# Patient Record
Sex: Female | Born: 1980 | Race: White | Hispanic: No | Marital: Single | Smoking: Current every day smoker
Health system: Southern US, Community
[De-identification: ages and names within clinical notes are randomized; demographics above are authoritative.]

## PROBLEM LIST (undated history)

## (undated) ENCOUNTER — Ambulatory Visit: Admission: EM | Payer: Medicaid Other | Source: Home / Self Care

## (undated) HISTORY — PX: TUBAL LIGATION: SHX77

---

## 2019-05-11 ENCOUNTER — Other Ambulatory Visit: Payer: Self-pay

## 2019-05-11 ENCOUNTER — Inpatient Hospital Stay (HOSPITAL_COMMUNITY)
Admission: AD | Admit: 2019-05-11 | Discharge: 2019-05-11 | Disposition: A | Discharge status: Home / Self Care | Payer: Self-pay | Attending: Obstetrics and Gynecology | Admitting: Obstetrics and Gynecology

## 2019-05-11 DIAGNOSIS — Z9851 Tubal ligation status: Secondary | ICD-10-CM

## 2019-05-11 DIAGNOSIS — Z3202 Encounter for pregnancy test, result negative: Secondary | ICD-10-CM | POA: Insufficient documentation

## 2019-05-11 LAB — POCT PREGNANCY, URINE: Preg Test, Ur: NEGATIVE

## 2019-05-11 LAB — HCG, SERUM, QUALITATIVE: Preg, Serum: NEGATIVE

## 2019-05-11 NOTE — MAU Note (Signed)
+  HPT x2 over the weekend.  Had tubal 16 yrs ago.  Last 4 months periods have been irregular.   Is supposed to be on cycle now, had n/v over the weekend, little bit of side pain.  Concerned may have a preg stuck in her tube. No pain now, bleeding almost stopped.

## 2019-05-11 NOTE — MAU Provider Note (Signed)
None    S Ms. Gwendolyn Franco is a 38 y.o. No obstetric history on file. non-pregnant female who presents to MAU today with complaint of positive pregnancy test x 2 at home.   O BP 133/78 (BP Location: Right Arm)   Pulse 79   Temp 98.3 F (36.8 C) (Oral)   Resp 18   Ht 5\' 4"  (1.626 m)   Wt 121.3 kg   LMP 05/09/2019 Comment: last was 7/7  SpO2 100%   BMI 45.92 kg/m  Physical Exam  Constitutional: She is oriented to person, place, and time. She appears well-developed and well-nourished. No distress.  Neurological: She is alert and oriented to person, place, and time.  Skin: She is not diaphoretic.  Psychiatric: Her behavior is normal.    A Non pregnant female Medical screening exam complete Quant negative  Work note provided.   P Discharge from MAU in stable condition Patient given the option of transfer to [facility] for further evaluation or seek care in outpatient facility of choice List of options for follow-up given  Warning signs for worsening condition that would warrant emergency follow-up discussed Patient may return to MAU as needed for pregnancy related complaints  Clay Menser, Artist Pais, NP 05/11/2019 7:14 PM

## 2021-01-25 ENCOUNTER — Encounter: Payer: Self-pay | Admitting: General Practice

## 2021-01-25 ENCOUNTER — Ambulatory Visit (INDEPENDENT_AMBULATORY_CARE_PROVIDER_SITE_OTHER): Payer: Medicaid Other | Admitting: Family Medicine

## 2021-01-25 ENCOUNTER — Other Ambulatory Visit (HOSPITAL_COMMUNITY)
Admission: RE | Admit: 2021-01-25 | Discharge: 2021-01-25 | Disposition: A | Discharge status: Home / Self Care | Payer: Medicaid Other | Source: Ambulatory Visit | Attending: Family Medicine | Admitting: Family Medicine

## 2021-01-25 ENCOUNTER — Ambulatory Visit: Payer: Medicaid Other | Admitting: Clinical

## 2021-01-25 ENCOUNTER — Encounter: Payer: Self-pay | Admitting: Family Medicine

## 2021-01-25 VITALS — BP 127/82 | HR 92 | Ht 62.0 in | Wt 302.0 lb

## 2021-01-25 DIAGNOSIS — Z01419 Encounter for gynecological examination (general) (routine) without abnormal findings: Secondary | ICD-10-CM | POA: Insufficient documentation

## 2021-01-25 DIAGNOSIS — Z01411 Encounter for gynecological examination (general) (routine) with abnormal findings: Secondary | ICD-10-CM

## 2021-01-25 DIAGNOSIS — F4323 Adjustment disorder with mixed anxiety and depressed mood: Secondary | ICD-10-CM

## 2021-01-25 DIAGNOSIS — Z113 Encounter for screening for infections with a predominantly sexual mode of transmission: Secondary | ICD-10-CM

## 2021-01-25 DIAGNOSIS — N914 Secondary oligomenorrhea: Secondary | ICD-10-CM

## 2021-01-25 DIAGNOSIS — Z1231 Encounter for screening mammogram for malignant neoplasm of breast: Secondary | ICD-10-CM

## 2021-01-25 DIAGNOSIS — Z6841 Body Mass Index (BMI) 40.0 and over, adult: Secondary | ICD-10-CM

## 2021-01-25 DIAGNOSIS — R102 Pelvic and perineal pain: Secondary | ICD-10-CM

## 2021-01-25 DIAGNOSIS — Z720 Tobacco use: Secondary | ICD-10-CM | POA: Insufficient documentation

## 2021-01-25 DIAGNOSIS — Z23 Encounter for immunization: Secondary | ICD-10-CM

## 2021-01-25 NOTE — Assessment & Plan Note (Signed)
Check u/s and treat based on results

## 2021-01-25 NOTE — BH Specialist Note (Signed)
I reviewed patient visit with the [facility] Intern, and I concur with the treatment plan, as documented in the [facility] Intern note.   No charge for this visit due to [facility] Intern seeing patient.  Hulda Marin, MSW, LCSW Integrated Behavioral Health Clinician [facility] Integrated Behavioral Health Initial In-Person Visit  MRN: [id number] Name: Gwendolyn Franco  Number of Integrated Behavioral Health Clinician visits:: 1/6 Session Start time: 11:30AM Session End time: 12PM Total time: 30 minutes  Types of Service: Individual psychotherapy  Interpretor:No. Interpretor Name and Language: N/a   Warm Hand Off Completed.       Subjective: Gwendolyn Franco is a 40 y.o. female  Patient was referred by Tinnie Gens, MD for Anxiety. Patient reports the following symptoms/concerns: mood swings where she is tearful about every day or may start yelling, tearfulness, and recent genetic concerns Duration of problem: Since her mid 30s, increased in the last year; Severity of problem: severe  Objective: Mood: Depressed and Affect: Tearful Risk of harm to self or others: No plan to harm self or others  Life Context: Family and Social: two sons (17, 21), sister that she talks to frequently School/Work: Currently employed; works from home Self-Care: Goes outside to read, takes hot baths Life Changes: Boyfriend moved in within the last two years  Patient and/or Family's Strengths/Protective Factors: Sense of purpose  Goals Addressed: Patient will: 1. Reduce symptoms of: anxiety and depression 2. Increase knowledge and/or ability of: coping skills and stress reduction  3. Demonstrate ability to: Increase healthy adjustment to current life circumstances  Progress towards Goals: Ongoing  Interventions: Interventions utilized: Mindfulness or Relaxation Training and Sleep Hygiene  Standardized Assessments completed: GAD-7 and PHQ 9  Patient and/or  Family Response: Pt expressed understanding and willingness to adhere to treatment plan.  Patient Centered Plan: Patient is on the following Treatment Plan(s):  IBH  Assessment: Patient currently experiencing Adjustment disorder with mixed anxiety and depressed mood.   Patient may benefit from practice coping skills, using grounding techniques when she feels overwhelmed.  Plan: 1. Follow up with behavioral health clinician on : 01/30/2021 at 4:30PM 2. Behavioral recommendations: Practice deep breathing, create a list of relaxing activities she can do once a week 3. Referral(s): Integrated [employer] (In Clinic) 4. "From scale of 1-10, how likely are you to follow plan?": 5  Bea Graff (Supervisor: Hulda Marin)  Flowsheet Row Office Visit from [facility]/25/2021 in [facility]  PHQ-9 Total Score 13     GAD 7 : Generalized Anxiety Score 01/25/2021  Nervous, Anxious, on Edge 3  Control/stop worrying 3  Worry too much - different things 3  Trouble relaxing 3  Restless 3  Easily annoyed or irritable 3  Afraid - awful might happen 2  Total GAD 7 Score 20

## 2021-01-25 NOTE — Patient Instructions (Signed)
Preventive Care 11-41 Years Old, Female Preventive care refers to lifestyle choices and visits with your health care provider that can promote health and wellness. This includes:  A yearly physical exam. This is also called an annual wellness visit.  Regular dental and eye exams.  Immunizations.  Screening for certain conditions.  Healthy lifestyle choices, such as: ? Eating a healthy diet. ? Getting regular exercise. ? Not using drugs or products that contain nicotine and tobacco. ? Limiting alcohol use. What can I expect for my preventive care visit? Physical exam Your health care provider may check your:  Height and weight. These may be used to calculate your BMI (body mass index). BMI is a measurement that tells if you are at a healthy weight.  Heart rate and blood pressure.  Body temperature.  Skin for abnormal spots. Counseling Your health care provider may ask you questions about your:  Past medical problems.  Family's medical history.  Alcohol, tobacco, and drug use.  Emotional well-being.  Home life and relationship well-being.  Sexual activity.  Diet, exercise, and sleep habits.  Work and work Statistician.  Access to firearms.  Method of birth control.  Menstrual cycle.  Pregnancy history. What immunizations do I need? Vaccines are usually given at various ages, according to a schedule. Your health care provider will recommend vaccines for you based on your age, medical history, and lifestyle or other factors, such as travel or where you work.   What tests do I need? Blood tests  Lipid and cholesterol levels. These may be checked every 5 years starting at age 64.  Hepatitis C test.  Hepatitis B test. Screening  Diabetes screening. This is done by checking your blood sugar (glucose) after you have not eaten for a while (fasting).  STD (sexually transmitted disease) testing, if you are at risk.  BRCA-related cancer screening. This may  be done if you have a family history of breast, ovarian, tubal, or peritoneal cancers.  Pelvic exam and Pap test. This may be done every 3 years starting at age 61. Starting at age 82, this may be done every 5 years if you have a Pap test in combination with an HPV test. Talk with your health care provider about your test results, treatment options, and if necessary, the need for more tests.   Follow these instructions at home: Eating and drinking  Eat a healthy diet that includes fresh fruits and vegetables, whole grains, lean protein, and low-fat dairy products.  Take vitamin and mineral supplements as recommended by your health care provider.  Do not drink alcohol if: ? Your health care provider tells you not to drink. ? You are pregnant, may be pregnant, or are planning to become pregnant.  If you drink alcohol: ? Limit how much you have to 0-1 drink a day. ? Be aware of how much alcohol is in your drink. In the [place] one drink equals one 12 oz bottle of beer (355 mL), one 5 oz glass of wine (148 mL), or one 1 oz glass of hard liquor (44 mL).   Lifestyle  Take daily care of your teeth and gums. Brush your teeth every morning and night with fluoride toothpaste. Floss one time each day.  Stay active. Exercise for at least 30 minutes 5 or more days each week.  Do not use any products that contain nicotine or tobacco, such as cigarettes, e-cigarettes, and chewing tobacco. If you need help quitting, ask your health care provider.  Do  not use drugs.  If you are sexually active, practice safe sex. Use a condom or other form of protection to prevent STIs (sexually transmitted infections).  If you do not wish to become pregnant, use a form of birth control. If you plan to become pregnant, see your health care provider for a prepregnancy visit.  Find healthy ways to cope with stress, such as: ? Meditation, yoga, or listening to music. ? Journaling. ? Talking to a trusted  person. ? Spending time with friends and family. Safety  Always wear your seat belt while driving or riding in a vehicle.  Do not drive: ? If you have been drinking alcohol. Do not ride with someone who has been drinking. ? When you are tired or distracted. ? While texting.  Wear a helmet and other protective equipment during sports activities.  If you have firearms in your house, make sure you follow all gun safety procedures.  Seek help if you have been physically or sexually abused. What's next?  Go to your health care provider once a year for an annual wellness visit.  Ask your health care provider how often you should have your eyes and teeth checked.  Stay up to date on all vaccines. This information is not intended to replace advice given to you by your health care provider. Make sure you discuss any questions you have with your health care provider. Document Revised: 05/22/2020 Document Reviewed: 06/05/2018 Elsevier Patient Education  2021 [employer].

## 2021-01-25 NOTE — Assessment & Plan Note (Signed)
Check FSH and TSH

## 2021-01-25 NOTE — Assessment & Plan Note (Signed)
No s/sx's of DM.

## 2021-01-25 NOTE — Progress Notes (Signed)
  Subjective:     Gwendolyn Franco is a 40 y.o. female and is here for a comprehensive physical exam. The patient reports problems - has not seen a physician in 17 years. Reports she skipped a cycle in February but mostly they are regular. Pelvic pain left side worse. Prior to cycles and with cycles and intercourse.    The following portions of the patient's history were reviewed and updated as appropriate: allergies, current medications, past family history, past medical history, past social history, past surgical history and problem list.  Review of Systems Pertinent items noted in HPI and remainder of comprehensive ROS otherwise negative.   Objective:    BP 127/82   Pulse 92   Ht 5\' 2"  (1.575 m)   Wt (!) 302 lb (137 kg)   LMP 01/17/2021 (Exact Date)   BMI 55.24 kg/m  General appearance: alert, cooperative and appears stated age Head: Normocephalic, without obvious abnormality, atraumatic Neck: no adenopathy, supple, symmetrical, trachea midline and thyroid not enlarged, symmetric, no tenderness/mass/nodules Lungs: clear to auscultation bilaterally Breasts: normal appearance, no masses or tenderness Heart: regular rate and rhythm, S1, S2 normal, no murmur, click, rub or gallop Abdomen: soft, non-tender; bowel sounds normal; no masses,  no organomegaly Pelvic: cervix normal in appearance, external genitalia normal, no cervical motion tenderness, uterus normal size, shape, and consistency, vagina normal without discharge and uterus and adnexa diffusely tender Extremities: Homans sign is negative, no sign of DVT Pulses: 2+ and symmetric Skin: Skin color, texture, turgor normal. No rashes or lesions Lymph nodes: Cervical, supraclavicular, and axillary nodes normal. Neurologic: Grossly normal    Assessment:    GYN female exam.      Plan:      Problem List Items Addressed This Visit      Unprioritized   Secondary oligomenorrhea    Check FSH and TSH      Relevant Orders    Follicle stimulating hormone   TSH   Pelvic pain    Check u/s and treat based on results      Relevant Orders   03/19/2021 PELVIC COMPLETE WITH TRANSVAGINAL   Class 3 severe obesity due to excess calories without serious comorbidity with body mass index (BMI) of 50.0 to 59.9 in adult (HCC)    No s/sx's of DM.      Tobacco use    Would not be a candidate for COC's due to age + smoking status       Other Visit Diagnoses    Screening mammogram for breast cancer    -  Primary   Relevant Orders   MM 3D SCREEN BREAST BILATERAL   Pap smear, as part of routine gynecological examination       Relevant Orders   Cytology - PAP( )   Screening for STD (sexually transmitted disease)       Relevant Orders   RPR   HIV Antibody (routine testing w rflx)   Hepatitis B Surface AntiGEN   Hepatitis C Antibody   Encounter for gynecological examination with abnormal finding       Need for Tdap vaccination       Relevant Orders   Tdap vaccine greater than or equal to 7yo IM (Completed)     Return in 6 weeks (on 03/08/2021) for virtual, a follow-up.  See After Visit Summary for Counseling Recommendations

## 2021-01-25 NOTE — Assessment & Plan Note (Signed)
Would not be a candidate for COC's due to age + smoking status

## 2021-01-26 LAB — RPR: RPR Ser Ql: NONREACTIVE

## 2021-01-26 LAB — HEPATITIS C ANTIBODY: Hep C Virus Ab: <0.1 s/co ratio (ref 0.0–0.9)

## 2021-01-26 LAB — FOLLICLE STIMULATING HORMONE: FSH: 8 m[IU]/mL

## 2021-01-26 LAB — HEPATITIS B SURFACE ANTIGEN: Hepatitis B Surface Ag: NEGATIVE

## 2021-01-26 LAB — HIV ANTIBODY (ROUTINE TESTING W REFLEX): HIV Screen 4th Generation wRfx: NONREACTIVE

## 2021-01-26 LAB — TSH: TSH: 3.63 u[IU]/mL (ref 0.450–4.500)

## 2021-01-26 LAB — SPECIMEN STATUS REPORT

## 2021-01-26 NOTE — BH Specialist Note (Deleted)
Integrated Behavioral Health via Telemedicine Visit  01/26/2021 Gwendolyn Franco [id number]  Number of Integrated Behavioral Health visits: *** Session Start time: 4:30***  Session End time: 5:30*** Total time: {IBH Total OZHY:86578469}  Referring Provider: *** Patient/Family location: Home*** [facility] Provider location: [facility]  All persons participating in visit: Patient *** and [facility] Johnetta Sloniker ***  Types of Service: {CHL AMB TYPE OF SERVICE:[phone]}  I connected with Tysha A Schramm and/or Tiera A Salomon's {family members:20773} via  Telephone or Engineer, civil (consulting)  (Video is Caregility application) and verified that I am speaking with the correct person using two identifiers. Discussed confidentiality: {YES/NO:21197}  I discussed the limitations of telemedicine and the availability of in person appointments.  Discussed there is a possibility of technology failure and discussed alternative modes of communication if that failure occurs.  I discussed that engaging in this telemedicine visit, they consent to the provision of behavioral healthcare and the services will be billed under their insurance.  Patient and/or legal guardian expressed understanding and consented to Telemedicine visit: {YES/NO:21197}  Presenting Concerns: Patient and/or family reports the following symptoms/concerns: *** Duration of problem: ***; Severity of problem: {Mild/Moderate/Severe:20260}  Patient and/or Family's Strengths/Protective Factors: {CHL AMB BH PROTECTIVE FACTORS:[phone]}  Goals Addressed: Patient will: 1.  Reduce symptoms of: {IBH Symptoms:21014056}  2.  Increase knowledge and/or ability of: {IBH Patient Tools:21014057}  3.  Demonstrate ability to: {IBH Goals:21014053}  Progress towards Goals: {CHL AMB BH PROGRESS TOWARDS GOALS:[phone]}  Interventions: Interventions utilized:  {IBH  Interventions:21014054} Standardized Assessments completed: {IBH Screening Tools:21014051}  Patient and/or Family Response: ***  Assessment: Patient currently experiencing ***.   Patient may benefit from ***.  Plan: 1. Follow up with behavioral health clinician on : *** 2. Behavioral recommendations: *** 3. Referral(s): {IBH Referrals:21014055}  I discussed the assessment and treatment plan with the patient and/or parent/guardian. They were provided an opportunity to ask questions and all were answered. They agreed with the plan and demonstrated an understanding of the instructions.   They were advised to call back or seek an in-person evaluation if the symptoms worsen or if the condition fails to improve as anticipated.  Rae Lips, LCSW   Depression screen [facility] 2/9 01/25/2021  Decreased Interest 3  Down, Depressed, Hopeless 3  PHQ - 2 Score 6  Altered sleeping 0  Tired, decreased energy 2  Change in appetite 0  Feeling bad or failure about yourself  3  Trouble concentrating 0  Moving slowly or fidgety/restless 2  Suicidal thoughts 0  PHQ-9 Score 13   GAD 7 : Generalized Anxiety Score 01/25/2021  Nervous, Anxious, on Edge 3  Control/stop worrying 3  Worry too much - different things 3  Trouble relaxing 3  Restless 3  Easily annoyed or irritable 3  Afraid - awful might happen 2  Total GAD 7 Score 20   ***

## 2021-01-27 LAB — CYTOLOGY - PAP
Adequacy: ABSENT
Chlamydia: NEGATIVE
Comment: NEGATIVE
Comment: NEGATIVE
Comment: NEGATIVE
Comment: NORMAL
Diagnosis: NEGATIVE
High risk HPV: NEGATIVE
Neisseria Gonorrhea: NEGATIVE
Trichomonas: NEGATIVE

## 2021-01-30 ENCOUNTER — Other Ambulatory Visit: Payer: Self-pay

## 2021-01-30 ENCOUNTER — Ambulatory Visit
Admission: RE | Admit: 2021-01-30 | Discharge: 2021-01-30 | Disposition: A | Discharge status: Home / Self Care | Payer: Medicaid Other | Source: Ambulatory Visit | Attending: Family Medicine | Admitting: Family Medicine

## 2021-01-30 ENCOUNTER — Telehealth: Payer: Self-pay | Admitting: Lactation Services

## 2021-01-30 DIAGNOSIS — Z1231 Encounter for screening mammogram for malignant neoplasm of breast: Secondary | ICD-10-CM

## 2021-01-30 NOTE — Telephone Encounter (Signed)
Patient called and LM on Nurse voicemail that she needs to cancel her Behavioral Health appointment today. She reports it is not covered by insurance and she cannot afford to come. Message to [facility], LCSW and front office to cancel.

## 2021-02-13 ENCOUNTER — Ambulatory Visit
Admission: RE | Admit: 2021-02-13 | Discharge: 2021-02-13 | Disposition: A | Discharge status: Home / Self Care | Payer: Medicaid Other | Source: Ambulatory Visit | Attending: Family Medicine | Admitting: Family Medicine

## 2021-02-13 ENCOUNTER — Other Ambulatory Visit: Payer: Self-pay

## 2021-02-13 DIAGNOSIS — R102 Pelvic and perineal pain: Secondary | ICD-10-CM | POA: Insufficient documentation

## 2021-03-07 ENCOUNTER — Encounter (INDEPENDENT_AMBULATORY_CARE_PROVIDER_SITE_OTHER): Payer: Self-pay

## 2021-03-09 ENCOUNTER — Encounter: Payer: Medicaid Other | Admitting: Family Medicine

## 2021-03-09 NOTE — Progress Notes (Signed)
8:33a- Called Pt to strt My Chart visit, no answer, left VM.  8:45a-2nd attempt, still no answer, VM full.

## 2021-03-09 NOTE — Progress Notes (Signed)
Patient did not keep appointment today. She may call to reschedule.  

## 2022-04-14 IMAGING — US US PELVIS COMPLETE WITH TRANSVAGINAL
1 series · 15 of 25 positions shown · non-contrast
Comparison: None

CLINICAL DATA: Pelvic pain, history Caesarean section, tubal
ligation, LMP 02/10/2021



[Series 1: us pelvis complete with transvaginal · 15 of 95 slices shown]
[im 1/95]
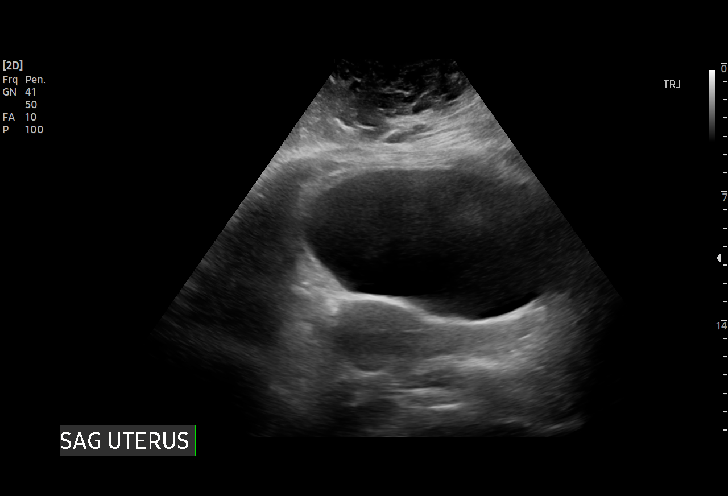
[im 8/95]
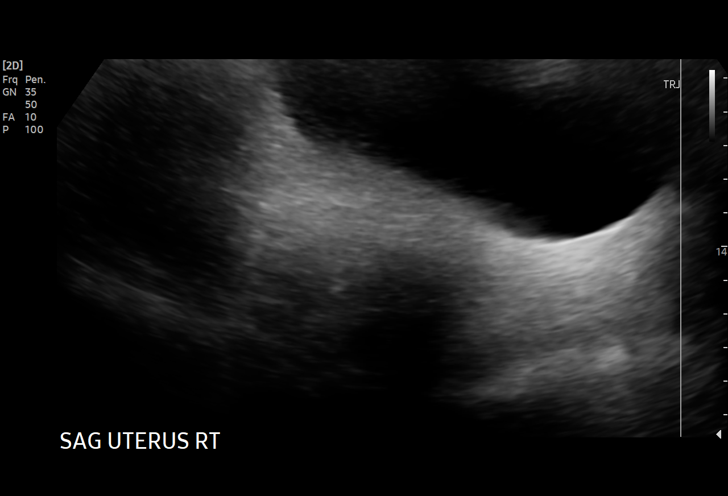
[im 16/95]
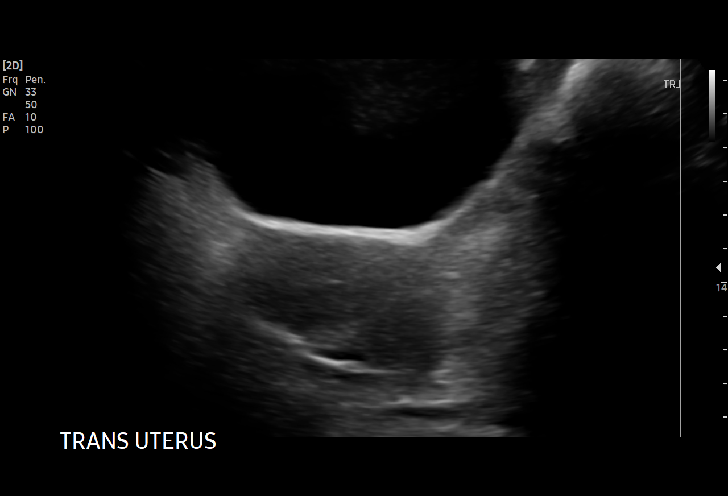
[im 20/95]
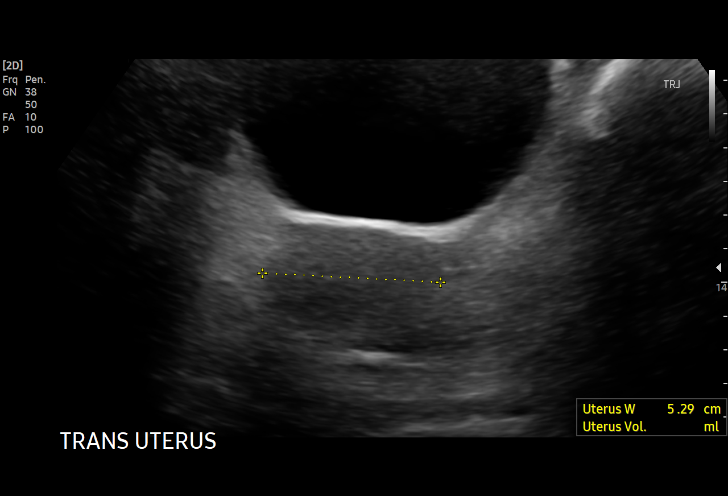
[im 28/95]
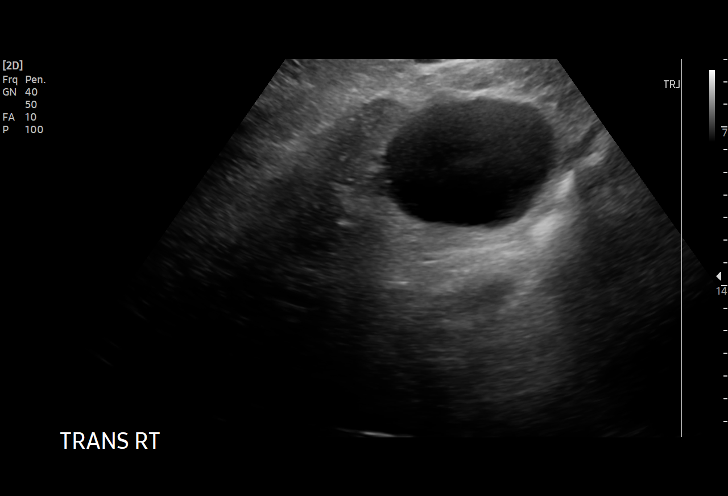
[im 36/95]
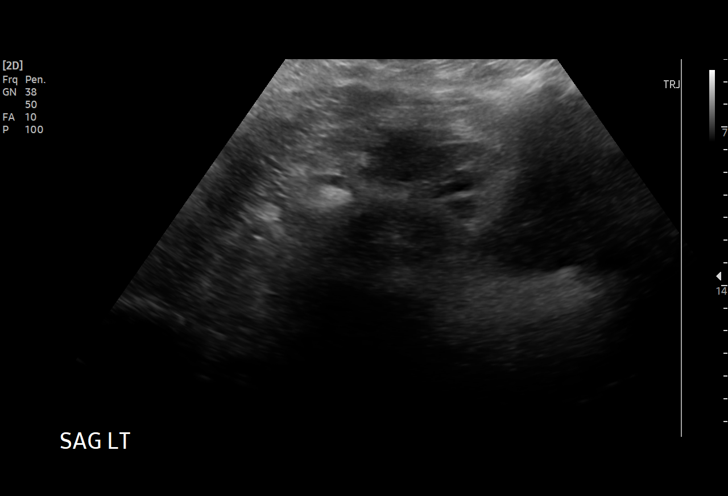
[im 40/95]
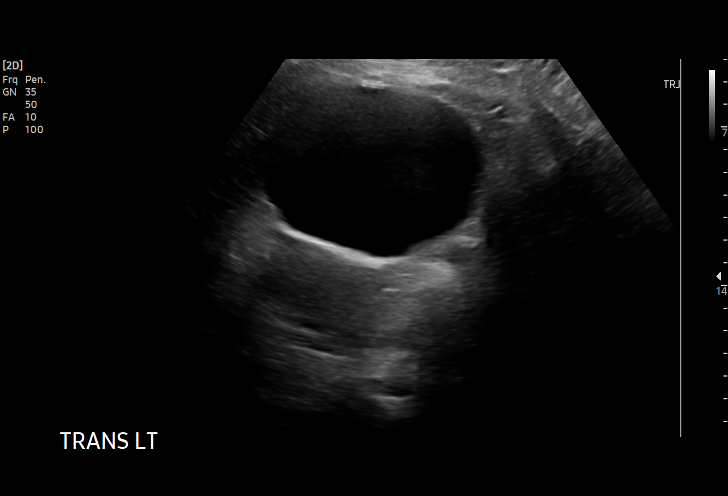
[im 48/95]
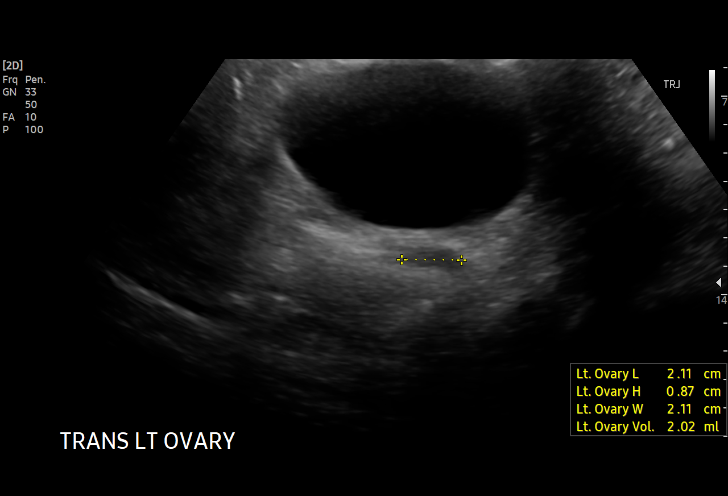
[im 55/95]
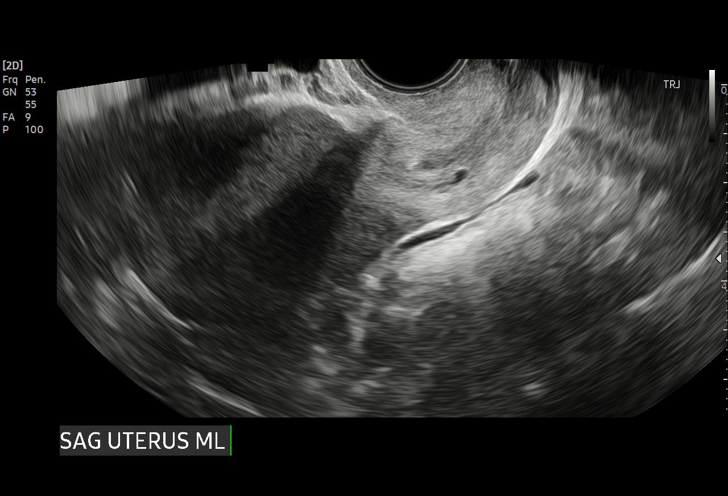
[im 59/95]
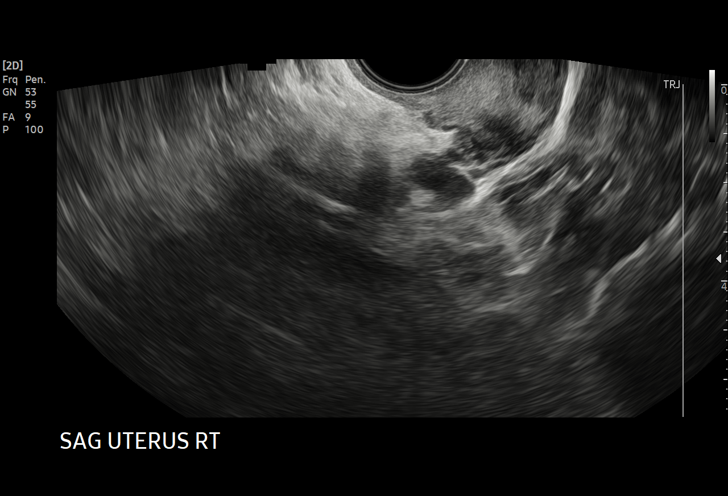
[im 67/95]
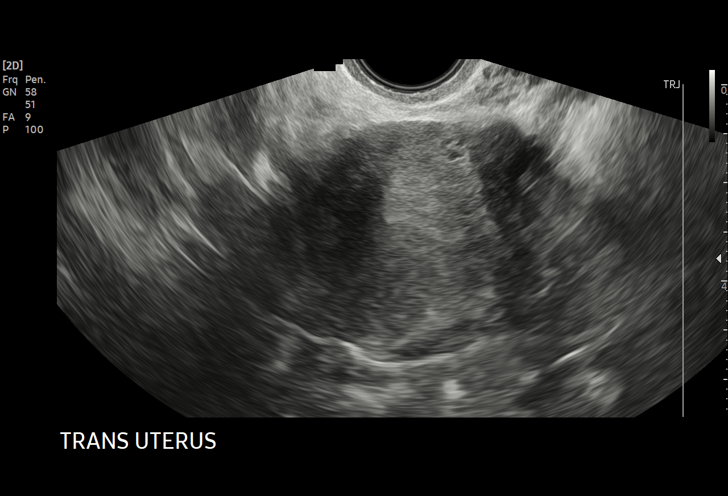
[im 75/95]
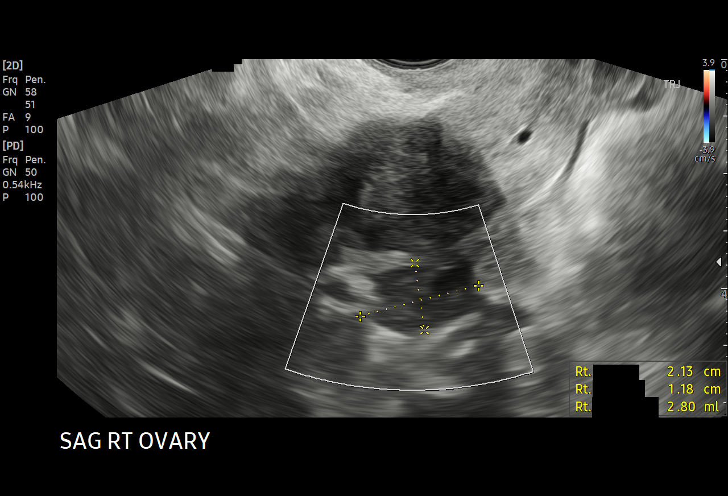
[im 79/95]
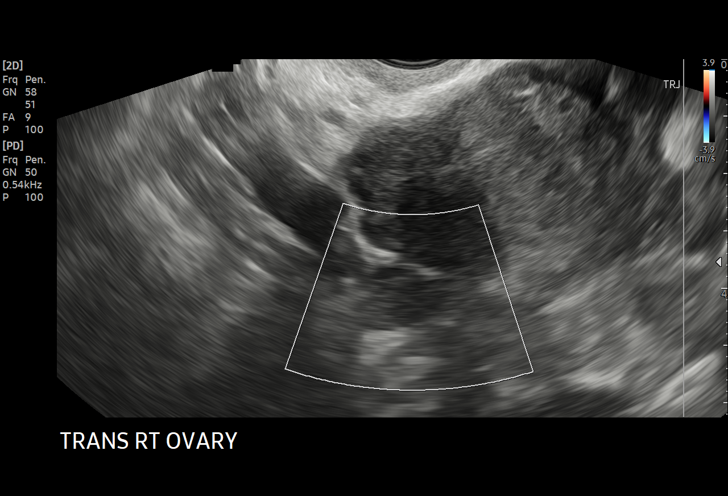
[im 87/95]
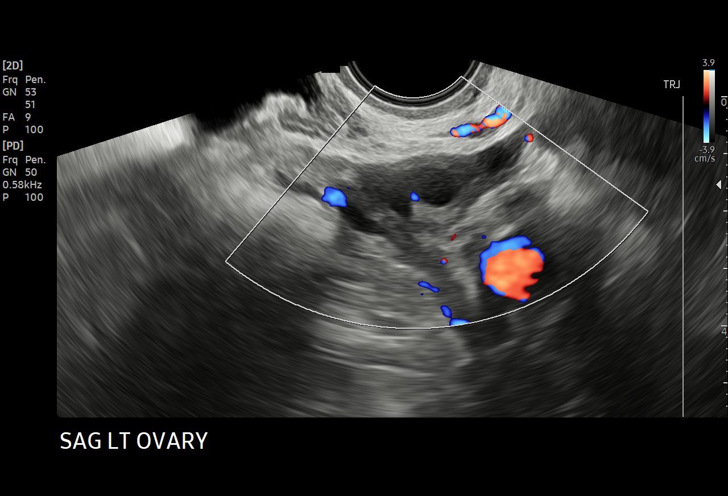
[im 95/95]
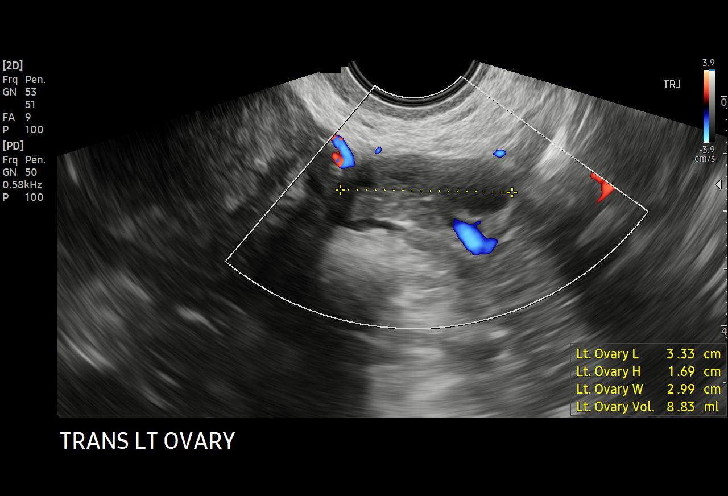

[15 of 25 positions shown; findings below may reference images not displayed]

FINDINGS: Uterus

Measurements: 9.5 x 4.4 x 4.7 cm = volume: 101 mL. Anteverted.
Heterogeneous myometrium. Shadowing from Caesarean section scar. No
discrete mass.

Endometrium

Thickness: 8 mm.  No endometrial fluid or focal abnormality

Right ovary

Measurements: 2.1 x 1.2 x 1.3 cm = volume: 1.7 mL. Normal morphology
without mass

Left ovary

Measurements: 3.3 x 1.7 x 3.0 cm = volume: 8.8 mL. Normal morphology
without mass

Other findings

No free pelvic fluid.  No adnexal masses.
IMPRESSION: Normal exam.

## 2022-08-25 ENCOUNTER — Telehealth: Payer: Medicaid Other | Admitting: Nurse Practitioner

## 2022-08-25 DIAGNOSIS — M25572 Pain in left ankle and joints of left foot: Secondary | ICD-10-CM

## 2022-08-25 DIAGNOSIS — M79645 Pain in left finger(s): Secondary | ICD-10-CM | POA: Diagnosis not present

## 2022-08-25 NOTE — Patient Instructions (Signed)
  Ferrel Logan, thank you for joining Bennie Pierini, FNP for today's virtual visit.  While this provider is not your primary care provider (PCP), if your PCP is located in our provider database this encounter information will be shared with them immediately following your visit.   A [facility] MyChart account gives you access to today's visit and all your visits, tests, and labs performed at [facility] " click here if you don't have a [facility] MyChart account or go to [email or web address]  Consent: (Patient) Gwendolyn Franco provided verbal consent for this virtual visit at the beginning of the encounter.  Current Medications:  Current Outpatient Medications:    Multiple Vitamins-Minerals (WOMENS MULTI VITAMIN & MINERAL PO), Take 1 tablet by mouth daily., Disp: , Rfl:    omeprazole (PRILOSEC) 20 MG capsule, Take 20 mg by mouth daily., Disp: , Rfl:    Medications ordered in this encounter:  No orders of the defined types were placed in this encounter.    *If you need refills on other medications prior to your next appointment, please contact your pharmacy*  Follow-Up: Call back or seek an in-person evaluation if the symptoms worsen or if the condition fails to improve as anticipated.  [facility] [phone]  Other Instructions Ice Elevate Motrin OTC Really need t9o go  to urgent care for xrays   If you have been instructed to have an in-person evaluation today at a local Urgent Care facility, please use the link below. It will take you to a list of all of our available [place] [facility], including address, phone number and hours of operation. Please do not delay care.  [facility]  If you or a family member do not have a primary care provider, use the link below to schedule a visit and establish care. When you choose a [place] primary care physician or advanced practice provider, you gain a long-term partner in  health. Find a Primary Care Provider  Learn more about [place]'s in-office and virtual care options: [place] - Get Care Now

## 2022-08-25 NOTE — Progress Notes (Signed)
Virtual Visit Consent   Gwendolyn Franco, you are scheduled for a virtual visit with Mary-Margaret Daphine Deutscher, FNP, a [facility] provider, today.     Just as with appointments in the office, your consent must be obtained to participate.  Your consent will be active for this visit and any virtual visit you may have with one of our providers in the next 365 days.     If you have a MyChart account, a copy of this consent can be sent to you electronically.  All virtual visits are billed to your insurance company just like a traditional visit in the office.    As this is a virtual visit, video technology does not allow for your provider to perform a traditional examination.  This may limit your provider's ability to fully assess your condition.  If your provider identifies any concerns that need to be evaluated in person or the need to arrange testing (such as labs, EKG, etc.), we will make arrangements to do so.     Although advances in technology are sophisticated, we cannot ensure that it will always work on either your end or our end.  If the connection with a video visit is poor, the visit may have to be switched to a telephone visit.  With either a video or telephone visit, we are not always able to ensure that we have a secure connection.     I need to obtain your verbal consent now.   Are you willing to proceed with your visit today? YES   Dawnya A Digirolamo has provided verbal consent on 08/25/2022 for a virtual visit (video or telephone).   Mary-Margaret Daphine Deutscher, FNP   Date: 08/25/2022 1:46 PM   Virtual Visit via Video Note   I, Mary-Margaret Reilly Blades, connected with AZLEE MONFORTE ([id number], 05-28-1981) on 08/25/22 at  2:00 PM EST by a video-enabled telemedicine application and verified that I am speaking with the correct person using two identifiers.  Location: Patient: Virtual Visit Location Patient: Home Provider: Virtual Visit Location Provider: Mobile   I discussed the limitations of  evaluation and management by telemedicine and the availability of in person appointments. The patient expressed understanding and agreed to proceed.    History of Present Illness: Gwendolyn Franco is a 41 y.o. who identifies as a female who was assigned female at birth, and is being seen today for ankle pain .  HPI: Patient fell out her back door and injured her: - pinky finger- left- pain  to move and is swollen- rates pain 10/10 - ankle- left- pain and swollen-ratespain 10/10.    Review of Systems  Musculoskeletal:  Positive for joint pain (left pinky finger and left ankle.).    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   Pelvic pain 01/25/2021   Class 3 severe obesity due to excess calories without serious comorbidity with body mass index (BMI) of 50.0 to 59.9 in adult [facility]) 01/25/2021   Tobacco use 01/25/2021    Allergies:  Allergies  Allergen Reactions   Hydrocodone Shortness Of Breath, Swelling and Rash   Medications:  Current Outpatient Medications:    Multiple Vitamins-Minerals (WOMENS MULTI VITAMIN & MINERAL PO), Take 1 tablet by mouth daily., Disp: , Rfl:    omeprazole (PRILOSEC) 20 MG capsule, Take 20 mg by mouth daily., Disp: , Rfl:   Observations/Objective: Patient is well-developed, well-nourished in no acute distress.  Resting comfortably  at home.  Head is normocephalic, atraumatic.  No labored breathing.  Speech is clear and coherent with logical content.  Patient is alert and oriented at baseline.  Swollen left 5th finger- limited rom due to pain Left ankle swollen with pain on innversion and eversion   Assessment and Plan:  Cyera A Abee in today with chief complaint of Ankle Pain   1. Acute left ankle pain 2. Finger pain, left Ice bid Rest  Elevate Motrin OTC Really needs xrays to make sure no broken bones      Follow Up Instructions: I discussed the assessment and treatment plan  with the patient. The patient was provided an opportunity to ask questions and all were answered. The patient agreed with the plan and demonstrated an understanding of the instructions.  A copy of instructions were sent to the patient via MyChart.  The patient was advised to call back or seek an in-person evaluation if the symptoms worsen or if the condition fails to improve as anticipated.  Time:  I spent 6 minutes with the patient via telehealth technology discussing the above problems/concerns.    Mary-Margaret Daphine Deutscher, FNP

## 2022-08-26 ENCOUNTER — Ambulatory Visit (INDEPENDENT_AMBULATORY_CARE_PROVIDER_SITE_OTHER): Payer: Medicaid Other

## 2022-08-26 ENCOUNTER — Ambulatory Visit
Admission: EM | Admit: 2022-08-26 | Discharge: 2022-08-26 | Disposition: A | Discharge status: Home / Self Care | Payer: Medicaid Other | Attending: Internal Medicine | Admitting: Internal Medicine

## 2022-08-26 DIAGNOSIS — M79642 Pain in left hand: Secondary | ICD-10-CM | POA: Diagnosis not present

## 2022-08-26 DIAGNOSIS — S62647A Nondisplaced fracture of proximal phalanx of left little finger, initial encounter for closed fracture: Secondary | ICD-10-CM

## 2022-08-26 DIAGNOSIS — M79662 Pain in left lower leg: Secondary | ICD-10-CM

## 2022-08-26 DIAGNOSIS — W19XXXA Unspecified fall, initial encounter: Secondary | ICD-10-CM

## 2022-08-26 DIAGNOSIS — M25572 Pain in left ankle and joints of left foot: Secondary | ICD-10-CM | POA: Diagnosis not present

## 2022-08-26 DIAGNOSIS — M79672 Pain in left foot: Secondary | ICD-10-CM | POA: Diagnosis not present

## 2022-08-26 DIAGNOSIS — S82839A Other fracture of upper and lower end of unspecified fibula, initial encounter for closed fracture: Secondary | ICD-10-CM

## 2022-08-26 MED ORDER — IBUPROFEN 800 MG PO TABS: 800.0000 mg | ORAL_TABLET | Freq: Three times a day (TID) | ORAL | 0 refills | Status: DC | PRN

## 2022-08-26 NOTE — ED Triage Notes (Signed)
Pt presents to [facility] with co of fall yesterday around 12; 30 in the afternoon. Pt reports she was trying to move her [employer] toy outside when she fell down the stairs and landed on her L hand and hurt her L ankle/ shin on the outer side. Pain is worse when moving joints. Pt has taken 800 mg motrin 2 times since incident

## 2022-08-26 NOTE — Discharge Instructions (Signed)
You have broken your left little finger as well as your ankle.  A boot has been applied for your ankle and crutches.  Nonweightbearing until otherwise advised by orthopedist.  No pushing, pulling, lifting with left hand until otherwise advised.  A splint has been applied to your left finger.  Recommend ice application and elevation of both extremities.  I have prescribed you ibuprofen to take as needed for pain.  Do not take any additional ibuprofen, Advil or Aleve while taking this prescription ibuprofen.  Follow-up with orthopedist tomorrow to schedule appointment for further evaluation and management.

## 2022-08-26 NOTE — ED Provider Notes (Signed)
[facility]    CSN: [id number] Arrival date & time: 08/26/22  1008      History   Chief Complaint Chief Complaint  Patient presents with   Fall    HPI Gwendolyn Franco is a 41 y.o. female.   Patient presents for left hand pain and left lower leg pain that started yesterday around 12:30 after a fall.  Patient reports that she tripped trying to move a bicycle while going down the stairs and fell down 1-2 steps.  She denies hitting her head or losing consciousness.  She states that she fell forward and landed on her left hand.  She also thinks that she twisted her left ankle.  She is having pain from the foot all the way up to the top of the lower leg.  She has taken ibuprofen with minimal improvement.  Denies any numbness or tingling.   Fall    History reviewed. No pertinent past medical history.  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) 01/25/2021   Tobacco use 01/25/2021    Past Surgical History:  Procedure Laterality Date   CESAREAN SECTION     x 2   TUBAL LIGATION      OB History     Gravida  3   Para  2   Term  2   Preterm      AB  1   Living  2      SAB  1   IAB      Ectopic      Multiple      Live Births  2            Home Medications    Prior to Admission medications   Medication Sig Start Date End Date Taking? Authorizing Provider  ibuprofen (ADVIL) 800 MG tablet Take 1 tablet (800 mg total) by mouth every 8 (eight) hours as needed for mild pain. 08/26/22  Yes Analeise Mccleery, Michele Rockers, FNP  Multiple Vitamins-Minerals (WOMENS MULTI VITAMIN & MINERAL PO) Take 1 tablet by mouth daily.    [provider]  omeprazole (PRILOSEC) 20 MG capsule Take 20 mg by mouth daily.    [provider]    Family  History Family History  Problem Relation Age of Onset   Hypertension Mother    Glaucoma Mother    Dementia Mother    Thyroid disease Mother    Diabetes Father    Breast cancer Sister 68   Thyroid disease Sister    Lung cancer Maternal Grandfather    Breast cancer Maternal Aunt    BRCA 1/2 Niece     Social History Social History   Tobacco Use   Smoking status: Every Day    Packs/day: 0.50    Years: 20.00    Total pack years: 10.00    Types: Cigarettes   Smokeless tobacco: Never  Substance Use Topics   Alcohol use: Yes    Comment: occ wine   Drug use: Never     Allergies   Hydrocodone   Review of Systems Review of Systems Per HPI  Physical Exam Triage Vital Signs ED Triage Vitals  Enc Vitals Group     BP 08/26/22 1101 132/79     Pulse Rate 08/26/22 1101 92  Resp 08/26/22 1101 19     Temp 08/26/22 1101 (!) 97.4 F (36.3 C)     Temp Source 08/26/22 1101 Oral     SpO2 08/26/22 1101 97 %     Weight --      Height --      Head Circumference --      Peak Flow --      Pain Score 08/26/22 1059 7     Pain Loc --      Pain Edu? --      Excl. in [place]? --    No data found.  Updated Vital Signs BP 132/79   Pulse 92   Temp (!) 97.4 F (36.3 C) (Oral)   Resp 19   LMP 07/30/2022   SpO2 97%   Visual Acuity Right Eye Distance:   Left Eye Distance:   Bilateral Distance:    Right Eye Near:   Left Eye Near:    Bilateral Near:     Physical Exam Constitutional:      General: She is not in acute distress.    Appearance: Normal appearance. She is not toxic-appearing or diaphoretic.  HENT:     Head: Normocephalic and atraumatic.  Eyes:     Extraocular Movements: Extraocular movements intact.     Conjunctiva/sclera: Conjunctivae normal.  Pulmonary:     Effort: Pulmonary effort is normal.  Musculoskeletal:       Legs:     Comments: Tenderness to palpation with associated swelling and bruising discoloration present to fourth and fifth fingers of left  hand that extends slightly into the dorsal surface of the hand.  Patient can wiggle fingers.  Grip strength is 5/5.  Neurovascular intact.  No obvious abrasions or lacerations noted.  Tenderness to palpation throughout dorsal surface of left foot.  No tenderness to toes.  Patient can wiggle toes.  Patient has circumferential swelling to left ankle with associated tenderness throughout anterior, medial, lateral ankle.  Patient has limited range of motion of ankle due to pain.  Tenderness to palpation to mid anterior shin of left lower extremity as well.  Also has tenderness to palpation to bilateral left lower leg at top of tip/fib.  Left lower extremity is neurovascularly intact throughout.  No obvious discoloration, lacerations, abrasions noted. No tenderness to knee.   Neurological:     General: No focal deficit present.     Mental Status: She is alert and oriented to person, place, and time. Mental status is at baseline.  Psychiatric:        Mood and Affect: Mood normal.        Behavior: Behavior normal.        Thought Content: Thought content normal.        Judgment: Judgment normal.      UC Treatments / Results  Labs (all labs ordered are listed, but only abnormal results are displayed) Labs Reviewed - No data to display  EKG   Radiology DG Ankle Complete Left  Addendum Date: 08/26/2022   ADDENDUM REPORT: 08/26/2022 12:11 ADDENDUM: In the radiographs of left tibia and fibula, there is linear calcific density in the lateral margin of tip of lateral malleolus suggesting recent avulsion. Electronically Signed   By: Elmer Picker M.D.   On: 08/26/2022 12:11   Result Date: 08/26/2022 CLINICAL DATA:  Trauma, fall EXAM: LEFT ANKLE COMPLETE - 3+ VIEW COMPARISON:  None FINDINGS: No definite recent fracture or dislocation is seen. There are few smooth marginated calcifications adjacent to the  tip of lateral malleolus. There are few smooth marginated calcifications adjacent to cuboid.  Plantar spur is seen in calcaneus. There is soft tissue swelling around the ankle. IMPRESSION: No recent fracture or dislocation is seen. Small smooth marginated calcifications adjacent to the tip of lateral malleolus may be residual from previous injury. Plantar spur is seen in calcaneus. Electronically Signed: By: Elmer Picker M.D. On: 08/26/2022 12:05   DG Tibia/Fibula Left  Result Date: 08/26/2022 CLINICAL DATA:  Trauma EXAM: LEFT TIBIA AND FIBULA - 2 VIEW COMPARISON:  None FINDINGS: There is a linear calcific density adjacent to the lateral margin of tip of lateral malleolus suggesting possible recent avulsion. There are few smooth marginated calcifications inferior to the tip of lateral malleolus suggesting old avulsions. IMPRESSION: There is recent avulsion in the lateral margin of tip of lateral malleolus in the distal left fibula. Electronically Signed   By: Elmer Picker M.D.   On: 08/26/2022 12:10   DG Foot Complete Left  Result Date: 08/26/2022 CLINICAL DATA:  Trauma, fall EXAM: LEFT FOOT - COMPLETE 3+ VIEW COMPARISON:  None FINDINGS: No recent fracture or dislocation is seen. There are a few smooth marginated calcifications adjacent to cuboid suggesting ununited accessory ossification centers or sesamoid bones. Plantar spur is seen in calcaneus. IMPRESSION: No recent fracture or dislocation is seen in left foot. Electronically Signed   By: Elmer Picker M.D.   On: 08/26/2022 12:08   DG Hand Complete Left  Result Date: 08/26/2022 CLINICAL DATA:  Trauma, fall EXAM: LEFT HAND - COMPLETE 3+ VIEW COMPARISON:  None Available. FINDINGS: There is radiolucent line in the proximal shaft of proximal phalanx of fifth finger. Rest of the bony structures are unremarkable. IMPRESSION: There is radiolucent line in the proximal shaft of proximal phalanx of left fifth finger suggesting undisplaced fracture. Electronically Signed   By: Elmer Picker M.D.   On: 08/26/2022 12:07     Procedures Procedures (including critical care time)  Medications Ordered in UC Medications - No data to display  Initial Impression / Assessment and Plan / UC Course  I have reviewed the triage vital signs and the nursing notes.  Pertinent labs & imaging results that were available during my care of the patient were reviewed by me and considered in my medical decision making (see chart for details).     Left hand x-ray showing nondisplaced fracture to proximal phalanx of left fifth digit.  Finger splint applied in urgent care.  Left tibia-fibula x-ray showing possible avulsion fracture to distal left fibula at lateral malleolus.  Cam boot applied in urgent care with crutches.  Advised patient nonweightbearing until otherwise advised by orthopedist.  Otherwise unremarkable x-rays for any additional acute bony abnormalities.  Advised of supportive care including ice application and elevation of extremities.  Patient offered narcotic pain medication but declined stating that she wished to simply take ibuprofen.  No obvious contraindications to NSAIDs noted in patient's history.  Will prescribe prescription ibuprofen and patient advised to not take any additional NSAIDs while taking this prescription ibuprofen.  Patient advised to follow-up with provided contact information for hand specialty/orthopedist tomorrow to schedule an appointment for further evaluation and management.  Patient verbalized understanding and was agreeable with plan. Final Clinical Impressions(s) / UC Diagnoses   Final diagnoses:  Closed nondisplaced fracture of proximal phalanx of left little finger, initial encounter  Avulsion fracture of distal fibula  Pain of joint of left ankle and foot     Discharge Instructions  You have broken your left little finger as well as your ankle.  A boot has been applied for your ankle and crutches.  Nonweightbearing until otherwise advised by orthopedist.  No pushing, pulling,  lifting with left hand until otherwise advised.  A splint has been applied to your left finger.  Recommend ice application and elevation of both extremities.  I have prescribed you ibuprofen to take as needed for pain.  Do not take any additional ibuprofen, Advil or Aleve while taking this prescription ibuprofen.  Follow-up with orthopedist tomorrow to schedule appointment for further evaluation and management.     ED Prescriptions     Medication Sig Dispense Auth. Provider   ibuprofen (ADVIL) 800 MG tablet Take 1 tablet (800 mg total) by mouth every 8 (eight) hours as needed for mild pain. 21 tablet [place], Michele Rockers, [place]      PDMP not reviewed this encounter.   Teodora Medici, [place] 08/26/22 1239

## 2023-01-18 ENCOUNTER — Ambulatory Visit
Admission: EM | Admit: 2023-01-18 | Discharge: 2023-01-18 | Disposition: A | Discharge status: Home / Self Care | Payer: Medicaid Other | Attending: Emergency Medicine | Admitting: Emergency Medicine

## 2023-01-18 DIAGNOSIS — K047 Periapical abscess without sinus: Secondary | ICD-10-CM | POA: Diagnosis not present

## 2023-01-18 MED ORDER — PENICILLIN V POTASSIUM 500 MG PO TABS: 500.0000 mg | ORAL_TABLET | Freq: Three times a day (TID) | ORAL | 0 refills | Status: AC

## 2023-01-18 MED ORDER — IBUPROFEN 800 MG PO TABS: 800.0000 mg | ORAL_TABLET | Freq: Three times a day (TID) | ORAL | 0 refills | Status: AC | PRN

## 2023-01-18 NOTE — ED Provider Notes (Signed)
[facility]    CSN: [id number] Arrival date & time: 01/18/23  1314    HISTORY  No chief complaint on file.  HPI Gwendolyn Franco is a pleasant, 42 y.o. female who presents to urgent care today. Pt presents with right side dental pain since last night.  The history is provided by the patient.   History reviewed. No pertinent past medical history. 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 Authorizing Provider  ibuprofen (ADVIL) 800 MG tablet Take 1 tablet (800 mg total) by mouth every 8 (eight) hours as needed for up to 21 doses for fever, headache, mild pain or moderate pain. 01/18/23  Yes Theadora Rama Scales, PA-C  penicillin v potassium (VEETID) 500 MG tablet Take 1 tablet (500 mg total) by mouth 3 (three) times daily for 14 days. 01/18/23 02/01/23 Yes Theadora Rama Scales, PA-C  Multiple Vitamins-Minerals (WOMENS MULTI VITAMIN & MINERAL PO) Take 1 tablet by mouth daily.    [provider]  omeprazole (PRILOSEC) 20 MG capsule Take 20 mg by mouth daily.    [provider]    Family History Family History  Problem Relation Age of Onset   Hypertension Mother    Glaucoma Mother    Dementia Mother    Thyroid disease Mother    Diabetes Father    Breast cancer Sister 45   Thyroid disease Sister    Lung cancer Maternal Grandfather     Breast cancer Maternal Aunt    BRCA 1/2 Niece    Social History Social History   Tobacco Use   Smoking status: Every Day    Packs/day: 0.50    Years: 20.00    Additional pack years: 0.00    Total pack years: 10.00    Types: Cigarettes   Smokeless tobacco: Never  Substance Use Topics   Alcohol use: Yes    Comment: occ wine   Drug use: Never   Allergies   Hydrocodone  Review of Systems Review of Systems Pertinent findings revealed after performing a 14 point review of systems has been noted in the history of present illness.  Physical Exam Vital Signs BP 128/89 (BP Location: Left Arm)   Pulse 76   Temp 97.6 F (36.4 C) (Oral)   Resp 17   SpO2 96%   No data found.  Physical Exam Vitals and nursing note reviewed.  Constitutional:      General: She is not in acute distress.    Appearance: Normal appearance.  HENT:     Head: Normocephalic and atraumatic.  Mouth/Throat:     Dentition: Dental caries and dental abscesses present.   Eyes:     Pupils: Pupils are equal, round, and reactive to light.  Cardiovascular:     Rate and Rhythm: Normal rate and regular rhythm.  Pulmonary:     Effort: Pulmonary effort is normal.     Breath sounds: Normal breath sounds.  Musculoskeletal:        General: Normal range of motion.     Cervical back: Normal range of motion and neck supple.  Skin:    General: Skin is warm and dry.  Neurological:     General: No focal deficit present.     Mental Status: She is alert and oriented to person, place, and time. Mental status is at baseline.  Psychiatric:        Mood and Affect: Mood normal.        Behavior: Behavior normal.        Thought Content: Thought content normal.        Judgment: Judgment normal.     Visual Acuity Right Eye Distance:   Left Eye Distance:   Bilateral Distance:    Right Eye Near:   Left Eye Near:    Bilateral Near:     UC Couse / Diagnostics / Procedures:     Radiology No results  found.  Procedures Procedures (including critical care time) EKG  Pending results:  Labs Reviewed - No data to display  Medications Ordered in UC: Medications - No data to display  UC Diagnoses / Final Clinical Impressions(s)   I have reviewed the triage vital signs and the nursing notes.  Pertinent labs & imaging results that were available during my care of the patient were reviewed by me and considered in my medical decision making (see chart for details).    Final diagnoses:  Dental abscess   ***  Please see discharge instructions below for details of plan of care as provided to patient. ED Prescriptions     Medication Sig Dispense Auth. Provider   penicillin v potassium (VEETID) 500 MG tablet Take 1 tablet (500 mg total) by mouth 3 (three) times daily for 14 days. 42 tablet Theadora Rama Scales, PA-C   ibuprofen (ADVIL) 800 MG tablet Take 1 tablet (800 mg total) by mouth every 8 (eight) hours as needed for up to 21 doses for fever, headache, mild pain or moderate pain. 21 tablet Theadora Rama Scales, PA-C      PDMP not reviewed this encounter.  Pending results:  Labs Reviewed - No data to display  Discharge Instructions:   Discharge Instructions      Please read below to learn more about the medications, dosages and frequencies that I recommend to help alleviate your symptoms and to get you feeling better soon:   Penicillin:  Please take one (1) dose three times daily for 14 days.  This antibiotic can cause upset stomach, this will resolve once antibiotics are complete.  You are welcome to take a probiotic, eat yogurt, take Imodium while taking this medication.  Please avoid other systemic medications such as Maalox, Pepto-Bismol or milk of magnesia as they can interfere with the body's ability to absorb the antibiotics.  Advil, Motrin (ibuprofen): This is a good anti-inflammatory medication which addresses aches, pains and inflammation of the upper airways that  causes sinus and nasal congestion as well as in the lower airways which makes your cough feel tight and sometimes burn.  I recommend that you take 800  mg every 6-8 hours as needed.  Please do not take more than 2400 mg of ibuprofen in a 24-hour period and please do not take high doses of ibuprofen three times daily for more than 3 days in a row as this can lead to stomach ulcers.   If symptoms have not meaningfully improved in the next 5 to 7 days, please return for repeat evaluation or follow-up with your regular provider.  If symptoms have worsened in the next 3 to 5 days, please return for repeat evaluation or follow-up with your regular provider.   Please also be sure that you secure a dental appointment within the next 14 days.   Thank you for visiting urgent care today.  We appreciate the opportunity to participate in your care.      Disposition Upon Discharge:  Condition: stable for discharge home  Patient presented with an acute illness with associated systemic symptoms and significant discomfort requiring urgent management. In my opinion, this is a condition that a prudent lay person (someone who possesses an average knowledge of health and medicine) may potentially expect to result in complications if not addressed urgently such as respiratory distress, impairment of bodily function or dysfunction of bodily organs.   Routine symptom specific, illness specific and/or disease specific instructions were discussed with the patient and/or caregiver at length.   As such, the patient has been evaluated and assessed, work-up was performed and treatment was provided in alignment with urgent care protocols and evidence based medicine.  Patient/parent/caregiver has been advised that the patient may require follow up for further testing and treatment if the symptoms continue in spite of treatment, as clinically indicated and appropriate.  Patient/parent/caregiver has been advised to return to the  [facility] or PCP if no better; to PCP or the Emergency Department if new signs and symptoms develop, or if the current signs or symptoms continue to change or worsen for further workup, evaluation and treatment as clinically indicated and appropriate  The patient will follow up with their current PCP if and as advised. If the patient does not currently have a PCP we will assist them in obtaining one.   The patient may need specialty follow up if the symptoms continue, in spite of conservative treatment and management, for further workup, evaluation, consultation and treatment as clinically indicated and appropriate.  Patient/parent/caregiver verbalized understanding and agreement of plan as discussed.  All questions were addressed during visit.  Please see discharge instructions below for further details of plan.  This office note has been dictated using Teaching laboratory technician.  Unfortunately, this method of dictation can sometimes lead to typographical or grammatical errors.  I apologize for your inconvenience in advance if this occurs.  Please do not hesitate to reach out to me if clarification is needed.

## 2023-01-18 NOTE — Discharge Instructions (Signed)
Please read below to learn more about the medications, dosages and frequencies that I recommend to help alleviate your symptoms and to get you feeling better soon:   Penicillin:  Please take one (1) dose three times daily for 14 days.  This antibiotic can cause upset stomach, this will resolve once antibiotics are complete.  You are welcome to take a probiotic, eat yogurt, take Imodium while taking this medication.  Please avoid other systemic medications such as Maalox, Pepto-Bismol or milk of magnesia as they can interfere with the body's ability to absorb the antibiotics.  Advil, Motrin (ibuprofen): This is a good anti-inflammatory medication which addresses aches, pains and inflammation of the upper airways that causes sinus and nasal congestion as well as in the lower airways which makes your cough feel tight and sometimes burn.  I recommend that you take 800 mg every 6-8 hours as needed.  Please do not take more than 2400 mg of ibuprofen in a 24-hour period and please do not take high doses of ibuprofen three times daily for more than 3 days in a row as this can lead to stomach ulcers.   If symptoms have not meaningfully improved in the next 5 to 7 days, please return for repeat evaluation or follow-up with your regular provider.  If symptoms have worsened in the next 3 to 5 days, please return for repeat evaluation or follow-up with your regular provider.   Please also be sure that you secure a dental appointment within the next 14 days.   Thank you for visiting urgent care today.  We appreciate the opportunity to participate in your care.

## 2023-01-18 NOTE — ED Triage Notes (Signed)
Pt presents with right side dental pain since last night.

## 2023-09-14 ENCOUNTER — Emergency Department (HOSPITAL_COMMUNITY)
Admission: EM | Admit: 2023-09-14 | Discharge: 2023-09-14 | Disposition: A | Discharge status: Home / Self Care | Payer: Medicaid Other | Attending: Emergency Medicine | Admitting: Emergency Medicine

## 2023-09-14 ENCOUNTER — Encounter (HOSPITAL_COMMUNITY): Payer: Self-pay

## 2023-09-14 ENCOUNTER — Other Ambulatory Visit: Payer: Self-pay

## 2023-09-14 DIAGNOSIS — W5501XA Bitten by cat, initial encounter: Secondary | ICD-10-CM | POA: Insufficient documentation

## 2023-09-14 DIAGNOSIS — S61401A Unspecified open wound of right hand, initial encounter: Secondary | ICD-10-CM | POA: Diagnosis not present

## 2023-09-14 DIAGNOSIS — Z23 Encounter for immunization: Secondary | ICD-10-CM | POA: Diagnosis not present

## 2023-09-14 DIAGNOSIS — S51801A Unspecified open wound of right forearm, initial encounter: Secondary | ICD-10-CM | POA: Insufficient documentation

## 2023-09-14 DIAGNOSIS — S61402A Unspecified open wound of left hand, initial encounter: Secondary | ICD-10-CM | POA: Diagnosis not present

## 2023-09-14 DIAGNOSIS — S59911A Unspecified injury of right forearm, initial encounter: Secondary | ICD-10-CM | POA: Diagnosis present

## 2023-09-14 DIAGNOSIS — T148XXA Other injury of unspecified body region, initial encounter: Secondary | ICD-10-CM

## 2023-09-14 DIAGNOSIS — S51802A Unspecified open wound of left forearm, initial encounter: Secondary | ICD-10-CM | POA: Insufficient documentation

## 2023-09-14 LAB — CBC WITH DIFFERENTIAL/PLATELET
Abs Immature Granulocytes: 0.05 10*3/uL (ref 0.00–0.07)
Basophils Absolute: 0 10*3/uL (ref 0.0–0.1)
Basophils Relative: 0 %
Eosinophils Absolute: 0.3 10*3/uL (ref 0.0–0.5)
Eosinophils Relative: 2 %
HCT: 41.7 % (ref 36.0–46.0)
Hemoglobin: 13.2 g/dL (ref 12.0–15.0)
Immature Granulocytes: 0 %
Lymphocytes Relative: 33 %
Lymphs Abs: 3.7 10*3/uL (ref 0.7–4.0)
MCH: 26.9 pg (ref 26.0–34.0)
MCHC: 31.7 g/dL (ref 30.0–36.0)
MCV: 85.1 fL (ref 80.0–100.0)
Monocytes Absolute: 0.6 10*3/uL (ref 0.1–1.0)
Monocytes Relative: 5 %
Neutro Abs: 6.5 10*3/uL (ref 1.7–7.7)
Neutrophils Relative %: 60 %
Platelets: 319 10*3/uL (ref 150–400)
RBC: 4.9 MIL/uL (ref 3.87–5.11)
RDW: 14.6 % (ref 11.5–15.5)
WBC: 11.1 10*3/uL — ABNORMAL HIGH (ref 4.0–10.5)
nRBC: 0 % (ref 0.0–0.2)

## 2023-09-14 LAB — COMPREHENSIVE METABOLIC PANEL
ALT: 23 U/L (ref 0–44)
AST: 20 U/L (ref 15–41)
Albumin: 3.6 g/dL (ref 3.5–5.0)
Alkaline Phosphatase: 77 U/L (ref 38–126)
Anion gap: 6 (ref 5–15)
BUN: 13 mg/dL (ref 6–20)
CO2: 23 mmol/L (ref 22–32)
Calcium: 8.7 mg/dL — ABNORMAL LOW (ref 8.9–10.3)
Chloride: 106 mmol/L (ref 98–111)
Creatinine, Ser: 0.83 mg/dL (ref 0.44–1.00)
GFR, Estimated: >60 mL/min (ref >60)
Glucose, Bld: 114 mg/dL — ABNORMAL HIGH (ref 70–99)
Potassium: 3.4 mmol/L — ABNORMAL LOW (ref 3.5–5.1)
Sodium: 135 mmol/L (ref 135–145)
Total Bilirubin: 0.8 mg/dL (ref <1.2)
Total Protein: 7.4 g/dL (ref 6.5–8.1)

## 2023-09-14 MED ORDER — TETANUS-DIPHTH-ACELL PERTUSSIS 5-2.5-18.5 LF-MCG/0.5 IM SUSY
0.5000 mL | PREFILLED_SYRINGE | Freq: Once | INTRAMUSCULAR | Status: AC
  Administered 2023-09-14: 0.5 mL via INTRAMUSCULAR
  Filled 2023-09-14: qty 0.5

## 2023-09-14 MED ORDER — AMOXICILLIN-POT CLAVULANATE 875-125 MG PO TABS
1.0000 | ORAL_TABLET | Freq: Once | ORAL | Status: AC
  Administered 2023-09-14: 1 via ORAL
  Filled 2023-09-14: qty 1

## 2023-09-14 MED ORDER — AMOXICILLIN-POT CLAVULANATE 875-125 MG PO TABS: 1.0000 | ORAL_TABLET | Freq: Two times a day (BID) | ORAL | 0 refills | Status: AC

## 2023-09-14 NOTE — ED Provider Notes (Signed)
[place] EMERGENCY DEPARTMENT AT [facility] Provider Note   CSN: [id number] Arrival date & time: 09/14/23  0032     History  Chief Complaint  Patient presents with   Animal Bite    Gwendolyn Franco is a 42 y.o. female.  The history is provided by the patient.  Patient presents after being attacked by a Engineer, structural. This occurred approximate 24 hours ago. Apparently they were feeding the stray cat when he became startled when her son turned on Christmas lights.  She has scratches and bites to both hands.  She had to remove a nail from one of her hands.  She reports over the past several hours the pain and redness has worsened.  No fevers or vomiting.  She is not diabetic Although it is a Engineer, structural, it is well-known to her neighborhood and was acting normally prior to the attack.     Home Medications Prior to Admission medications   Medication Sig Start Date End Date Taking? Authorizing Provider  amoxicillin-clavulanate (AUGMENTIN) 875-125 MG tablet Take 1 tablet by mouth every 12 (twelve) hours. 09/14/23  Yes Zadie Rhine, MD  clotrimazole (LOTRIMIN) 1 % cream Apply 1 Application topically 2 (two) times daily. 12/06/22   [provider]  ibuprofen (ADVIL) 800 MG tablet Take 1 tablet (800 mg total) by mouth every 8 (eight) hours as needed for up to 21 doses for fever, headache, mild pain or moderate pain. 01/18/23   Theadora Rama Scales, PA-C  ketoconazole (NIZORAL) 2 % cream Apply 1 Application topically 2 (two) times daily. 03/14/23   [provider]  nystatin (MYCOSTATIN/NYSTOP) powder Apply 1 Application topically 3 (three) times daily. 03/14/23 03/13/24  [provider]  omeprazole (PRILOSEC) 20 MG capsule Take 20 mg by mouth daily.    [provider]      Allergies    Hydrocodone    Review of Systems   Review of Systems  Physical Exam Updated Vital Signs BP 118/75   Pulse 77   Temp 98.3 F (36.8 C) (Oral)   Resp 12   Ht  1.575 m (5\' 2" )   Wt (!) 137 kg   SpO2 97%   BMI 55.24 kg/m  Physical Exam CONSTITUTIONAL: Well developed/well nourished HEAD: Normocephalic/atraumatic NEURO: Pt is awake/alert/appropriate, moves all extremitiesx4.  No facial droop.   EXTREMITIES: pulses normal/equal, full ROM SKIN: warm, color normal See photo below.  Multiple wounds noted to the bilateral forearms and hands.  There is no fluctuance noted.  No abscesses noted.  She is able make a fist with both hands PSYCH: no abnormalities of mood noted, alert and oriented to situation         ED Results / Procedures / Treatments   Labs (all labs ordered are listed, but only abnormal results are displayed) Labs Reviewed  CBC WITH DIFFERENTIAL/PLATELET - Abnormal; Notable for the following components:      Result Value   WBC 11.1 (*)    All other components within normal limits  COMPREHENSIVE METABOLIC PANEL - Abnormal; Notable for the following components:   Potassium 3.4 (*)    Glucose, Bld 114 (*)    Calcium 8.7 (*)    All other components within normal limits    EKG None  Radiology No results found.  Procedures Procedures    Medications Ordered in ED Medications  amoxicillin-clavulanate (AUGMENTIN) 875-125 MG per tablet 1 tablet (has no administration in time range)  Tdap (BOOSTRIX) injection 0.5 mL (0.5 mLs  Intramuscular Given 09/14/23 0441)    ED Course/ Medical Decision Making/ A&P                                 Medical Decision Making Amount and/or Complexity of Data Reviewed Labs: ordered.  Risk Prescription drug management.   Presents after being attacked by a Engineer, structural She has mild leukocytosis but otherwise labs are reassuring Patient is well-appearing.  No signs of any deep space infection or abscess at this time.  She will be placed on Augmentin.  Discussed wound care and strict return precautions        Final Clinical Impression(s) / ED Diagnoses Final diagnoses:  Animal bite   Cat bite, initial encounter    Rx / DC Orders ED Discharge Orders          Ordered    amoxicillin-clavulanate (AUGMENTIN) 875-125 MG tablet  Every 12 hours        09/14/23 0427              Zadie Rhine, MD 09/14/23 0448

## 2023-09-14 NOTE — ED Triage Notes (Addendum)
Pt reports with cat bites and scratches to her arms and hands since yesterday from a stray cat in her neighborhood. Pt reports feeding the cat and it became startled when her son turned on the [employer]. Pt also reports removing a cat nail from her hand.
